# Patient Record
Sex: Female | Born: 1985 | Race: White | Hispanic: No | Marital: Single | Smoking: Former smoker
Health system: Southern US, Community
[De-identification: ages and names within clinical notes are randomized; demographics above are authoritative.]

## PROBLEM LIST (undated history)

## (undated) HISTORY — PX: ABDOMINAL HYSTERECTOMY: SHX81

---

## 2008-10-02 ENCOUNTER — Emergency Department (HOSPITAL_COMMUNITY): Admission: EM | Admit: 2008-10-02 | Discharge: 2008-10-02 | Payer: Self-pay | Admitting: Emergency Medicine

## 2009-12-26 ENCOUNTER — Emergency Department (HOSPITAL_COMMUNITY): Admission: EM | Admit: 2009-12-26 | Discharge: 2009-12-26 | Payer: Self-pay | Admitting: Emergency Medicine

## 2010-06-25 ENCOUNTER — Emergency Department (HOSPITAL_COMMUNITY): Admission: EM | Admit: 2010-06-25 | Discharge: 2010-06-25 | Payer: Self-pay | Admitting: Emergency Medicine

## 2010-12-07 LAB — CBC
HCT: 35 % — ABNORMAL LOW (ref 36.0–46.0)
Hemoglobin: 12 g/dL (ref 12.0–15.0)
MCH: 31.4 pg (ref 26.0–34.0)
MCHC: 34.3 g/dL (ref 30.0–36.0)
MCV: 91.6 fL (ref 78.0–100.0)
RBC: 3.82 MIL/uL — ABNORMAL LOW (ref 3.87–5.11)

## 2010-12-07 LAB — GC/CHLAMYDIA PROBE AMP, GENITAL
Chlamydia, DNA Probe: POSITIVE — AB
GC Probe Amp, Genital: NEGATIVE

## 2010-12-07 LAB — DIFFERENTIAL
Basophils Relative: 0 % (ref 0–1)
Eosinophils Absolute: 0.1 10*3/uL (ref 0.0–0.7)
Eosinophils Relative: 2 % (ref 0–5)
Lymphs Abs: 2.6 10*3/uL (ref 0.7–4.0)
Monocytes Absolute: 0.5 10*3/uL (ref 0.1–1.0)
Monocytes Relative: 5 % (ref 3–12)

## 2010-12-07 LAB — URINALYSIS, ROUTINE W REFLEX MICROSCOPIC
Bilirubin Urine: NEGATIVE
Glucose, UA: NEGATIVE mg/dL
Ketones, ur: NEGATIVE mg/dL
Nitrite: NEGATIVE
Specific Gravity, Urine: 1.01 (ref 1.005–1.030)
pH: 6.5 (ref 5.0–8.0)

## 2010-12-07 LAB — WET PREP, GENITAL
WBC, Wet Prep HPF POC: NONE SEEN
Yeast Wet Prep HPF POC: NONE SEEN

## 2010-12-13 LAB — CBC
HCT: 37.6 % (ref 36.0–46.0)
Hemoglobin: 13.1 g/dL (ref 12.0–15.0)
MCHC: 34.9 g/dL (ref 30.0–36.0)
Platelets: 203 10*3/uL (ref 150–400)
RDW: 13 % (ref 11.5–15.5)

## 2010-12-13 LAB — DIFFERENTIAL
Lymphocytes Relative: 28 % (ref 12–46)
Lymphs Abs: 2.4 10*3/uL (ref 0.7–4.0)
Monocytes Absolute: 0.5 10*3/uL (ref 0.1–1.0)
Monocytes Relative: 6 % (ref 3–12)
Neutro Abs: 5.3 10*3/uL (ref 1.7–7.7)
Neutrophils Relative %: 63 % (ref 43–77)

## 2010-12-13 LAB — COMPREHENSIVE METABOLIC PANEL
Albumin: 3.9 g/dL (ref 3.5–5.2)
BUN: 13 mg/dL (ref 6–23)
Calcium: 9.3 mg/dL (ref 8.4–10.5)
Glucose, Bld: 115 mg/dL — ABNORMAL HIGH (ref 70–99)
Potassium: 4.2 mEq/L (ref 3.5–5.1)
Sodium: 138 mEq/L (ref 135–145)
Total Protein: 6.7 g/dL (ref 6.0–8.3)

## 2010-12-13 LAB — URINALYSIS, ROUTINE W REFLEX MICROSCOPIC
Glucose, UA: NEGATIVE mg/dL
Hgb urine dipstick: NEGATIVE
Protein, ur: NEGATIVE mg/dL
Specific Gravity, Urine: 1.028 (ref 1.005–1.030)
pH: 6.5 (ref 5.0–8.0)

## 2010-12-13 LAB — POCT PREGNANCY, URINE: Preg Test, Ur: NEGATIVE

## 2011-01-08 LAB — DIFFERENTIAL
Lymphs Abs: 1.9 10*3/uL (ref 0.7–4.0)
Monocytes Relative: 4 % (ref 3–12)
Neutro Abs: 7 10*3/uL (ref 1.7–7.7)
Neutrophils Relative %: 73 % (ref 43–77)

## 2011-01-08 LAB — RAPID URINE DRUG SCREEN, HOSP PERFORMED
Barbiturates: NOT DETECTED
Benzodiazepines: NOT DETECTED
Cocaine: NOT DETECTED
Opiates: NOT DETECTED

## 2011-01-08 LAB — BASIC METABOLIC PANEL
Calcium: 9.3 mg/dL (ref 8.4–10.5)
Chloride: 103 mEq/L (ref 96–112)
Creatinine, Ser: 0.54 mg/dL (ref 0.4–1.2)
GFR calc Af Amer: >60 mL/min (ref >60)
Sodium: 136 mEq/L (ref 135–145)

## 2011-01-08 LAB — CBC
Hemoglobin: 12.8 g/dL (ref 12.0–15.0)
MCV: 92.5 fL (ref 78.0–100.0)
RBC: 4.13 MIL/uL (ref 3.87–5.11)
WBC: 9.6 10*3/uL (ref 4.0–10.5)

## 2011-01-08 LAB — ETHANOL: Alcohol, Ethyl (B): <5 mg/dL (ref 0–10)

## 2011-01-08 LAB — TRICYCLICS SCREEN, URINE: TCA Scrn: NOT DETECTED

## 2011-01-08 LAB — URINALYSIS, ROUTINE W REFLEX MICROSCOPIC
Bilirubin Urine: NEGATIVE
Glucose, UA: NEGATIVE mg/dL
Nitrite: NEGATIVE
Specific Gravity, Urine: 1.017 (ref 1.005–1.030)
pH: 6 (ref 5.0–8.0)

## 2020-09-14 ENCOUNTER — Emergency Department (INDEPENDENT_AMBULATORY_CARE_PROVIDER_SITE_OTHER)
Admission: EM | Admit: 2020-09-14 | Discharge: 2020-09-14 | Disposition: A | Discharge status: Home / Self Care | Payer: 59 | Source: Home / Self Care | Attending: Family Medicine | Admitting: Family Medicine

## 2020-09-14 ENCOUNTER — Other Ambulatory Visit: Payer: Self-pay

## 2020-09-14 ENCOUNTER — Emergency Department (INDEPENDENT_AMBULATORY_CARE_PROVIDER_SITE_OTHER): Payer: 59

## 2020-09-14 DIAGNOSIS — N83201 Unspecified ovarian cyst, right side: Secondary | ICD-10-CM | POA: Diagnosis not present

## 2020-09-14 DIAGNOSIS — M545 Low back pain, unspecified: Secondary | ICD-10-CM | POA: Diagnosis not present

## 2020-09-14 LAB — POCT URINALYSIS DIP (MANUAL ENTRY)
Bilirubin, UA: NEGATIVE
Blood, UA: NEGATIVE
Glucose, UA: NEGATIVE mg/dL
Leukocytes, UA: NEGATIVE
Nitrite, UA: NEGATIVE
Protein Ur, POC: NEGATIVE mg/dL
Spec Grav, UA: 1.025 (ref 1.010–1.025)
Urobilinogen, UA: 0.2 E.U./dL
pH, UA: 6 (ref 5.0–8.0)

## 2020-09-14 LAB — POCT URINE PREGNANCY: Preg Test, Ur: NEGATIVE

## 2020-09-14 MED ORDER — IOHEXOL 300 MG/ML  SOLN
100.0000 mL | Freq: Once | INTRAMUSCULAR | Status: AC | PRN
  Administered 2020-09-14: 100 mL via INTRAVENOUS

## 2020-09-14 MED ORDER — PREDNISONE 10 MG PO TABS: 20.0000 mg | ORAL_TABLET | Freq: Every day | ORAL | 0 refills | Status: AC

## 2020-09-14 MED ORDER — KETOROLAC TROMETHAMINE 60 MG/2ML IM SOLN
60.0000 mg | Freq: Once | INTRAMUSCULAR | Status: AC
  Administered 2020-09-14: 60 mg via INTRAMUSCULAR

## 2020-09-14 NOTE — ED Triage Notes (Signed)
Pt presents with lower R back pain that woke her up. Patient thinks that it may be a kidney stone. Patient has had kidney stone previously.

## 2020-09-14 NOTE — ED Provider Notes (Signed)
[facility]    CSN: [id number] Arrival date & time: 09/14/20  1356      History   Chief Complaint Chief Complaint  Patient presents with  . Back Pain    Lower right     HPI Erin Johnson is a 34 y.o. female.  Patient presents with sudden onset of right lower back pain with radiation around to the right lower quadrant.  She has a history of degenerative disc disease as well as kidney stones.  There is no radiation to the leg.  There is no difficulty urinating.   HPI  History reviewed. No pertinent past medical history.  There are no problems to display for this patient.   Past Surgical History:  Procedure Laterality Date  . ABDOMINAL HYSTERECTOMY      OB History   No obstetric history on file.      Home Medications    Prior to Admission medications   Medication Sig Start Date End Date Taking? Authorizing Provider  amitriptyline (ELAVIL) 25 MG tablet Take 1 tablet by mouth daily. 01/01/20  Yes [provider]  buPROPion (WELLBUTRIN XL) 300 MG 24 hr tablet Take by mouth. 01/01/20  Yes [provider]  FLUoxetine (PROZAC) 20 MG capsule Take by mouth. 01/01/20  Yes [provider]  hydrOXYzine (ATARAX/VISTARIL) 50 MG tablet Take by mouth. 06/01/16  Yes [provider]  tiZANidine (ZANAFLEX) 4 MG tablet Take by mouth. 01/01/20  Yes [provider]  valACYclovir (VALTREX) 1000 MG tablet Take 2 tablets at the onset and repeat in 12 hours 08/10/20  Yes [provider]    Family History Family History  Problem Relation Age of Onset  . Diabetes Mother     Social History Social History   Tobacco Use  . Smoking status: Former Smoker    Packs/day: 1.00    Years: 15.00    Pack years: 15.00    Quit date: 2019    Years since quitting: 2.9  . Smokeless tobacco: Never Used  Vaping Use  . Vaping Use: Every day  Substance Use Topics  . Alcohol use: Yes    Comment: socially   . Drug use: Never      Allergies   Penicillins   Review of Systems Review of Systems  Gastrointestinal: Positive for abdominal pain.  Musculoskeletal: Positive for back pain.  All other systems reviewed and are negative.    Physical Exam Triage Vital Signs ED Triage Vitals  Enc Vitals Group     BP 09/14/20 1414 133/77     Pulse Rate 09/14/20 1414 88     Resp 09/14/20 1414 20     Temp 09/14/20 1414 98.6 F (37 C)     Temp Source 09/14/20 1414 Oral     SpO2 09/14/20 1414 98 %     Weight 09/14/20 1410 165 lb (74.8 kg)     Height 09/14/20 1410 5\' 1"  (1.549 m)     Head Circumference --      Peak Flow --      Pain Score 09/14/20 1410 7     Pain Loc --      Pain Edu? --      Excl. in GC? --    No data found.  Updated Vital Signs BP 133/77 (BP Location: Right Arm)   Pulse 88   Temp 98.6 F (37 C) (Oral)   Resp 20   Ht 5\' 1"  (1.549 m)   Wt 74.8 kg   SpO2  98%   BMI 31.18 kg/m   Visual Acuity Right Eye Distance:   Left Eye Distance:   Bilateral Distance:    Right Eye Near:   Left Eye Near:    Bilateral Near:     Physical Exam Vitals and nursing note reviewed.  Constitutional:      Appearance: Normal appearance.  Cardiovascular:     Rate and Rhythm: Normal rate and regular rhythm.  Pulmonary:     Effort: Pulmonary effort is normal.     Breath sounds: Normal breath sounds.  Abdominal:     General: Abdomen is flat.     Palpations: Abdomen is soft.     Tenderness: There is abdominal tenderness.     Comments: Right lower quadrant No rebound  Musculoskeletal:     Comments: Back: Normal range of motion Straight leg raising is negative No tenderness with percussion over the lumbar spine  Neurological:     General: No focal deficit present.     Mental Status: She is alert and oriented to person, place, and time.     Deep Tendon Reflexes: Reflexes normal.      UC Treatments / Results  Labs (all labs ordered are listed, but only abnormal results are displayed) Labs  Reviewed  POCT URINALYSIS DIP (MANUAL ENTRY) - Abnormal; Notable for the following components:      Result Value   Ketones, POC UA trace (5) (*)    All other components within normal limits    EKG   Radiology No results found.  Procedures Procedures (including critical care time)  Medications Ordered in UC Medications - No data to display  Initial Impression / Assessment and Plan / UC Course  I have reviewed the triage vital signs and the nursing notes.  Pertinent labs & imaging results that were available during my care of the patient were reviewed by me and considered in my medical decision making (see chart for details).     Back and abdominal pain.  Patient underwent CT scan which showed no acute appendix or stone.  There was no blood in the urine. Final Clinical Impressions(s) / UC Diagnoses   Final diagnoses:  None   Discharge Instructions   None    ED Prescriptions    None     PDMP not reviewed this encounter.   Erin Kuster, MD 09/14/20 [phone]

## 2021-12-26 ENCOUNTER — Emergency Department (INDEPENDENT_AMBULATORY_CARE_PROVIDER_SITE_OTHER): Payer: No Typology Code available for payment source

## 2021-12-26 ENCOUNTER — Emergency Department
Admission: EM | Admit: 2021-12-26 | Discharge: 2021-12-26 | Disposition: A | Discharge status: Home / Self Care | Payer: No Typology Code available for payment source | Source: Home / Self Care

## 2021-12-26 DIAGNOSIS — M25571 Pain in right ankle and joints of right foot: Secondary | ICD-10-CM

## 2021-12-26 NOTE — ED Provider Notes (Signed)
?[facility] ? ? ? ?CSN: [id number] ?Arrival date & time: 12/26/21  1254 ? ? ?  ? ?History   ?Chief Complaint ?Chief Complaint  ?Patient presents with  ? Ankle Pain  ?  RT  ? ? ?HPI ?Erin Johnson is a 36 y.o. female.  ? ?HPI 36 year old female presents with right ankle pain since last night when she rolled it at her kids softball game.  Patient reports hearing a pop sound with bruising and swelling starting today.  Patient reports limited range of motion when walking.  Reports pain currently as 8 of 10. ? ?History reviewed. No pertinent past medical history. ? ?There are no problems to display for this patient. ? ? ?Past Surgical History:  ?Procedure Laterality Date  ? ABDOMINAL HYSTERECTOMY    ? ? ?OB History   ?No obstetric history on file. ?  ? ? ? ?Home Medications   ? ?Prior to Admission medications   ?Medication Sig Start Date End Date Taking? Authorizing Provider  ?sertraline (ZOLOFT) 50 MG tablet Take 1 tablet by mouth daily. 11/25/15  Yes [provider]  ?amitriptyline (ELAVIL) 25 MG tablet Take 1 tablet by mouth daily. 01/01/20   [provider]  ?buPROPion (WELLBUTRIN XL) 300 MG 24 hr tablet Take by mouth. 01/01/20   [provider]  ?cyclobenzaprine (FLEXERIL) 10 MG tablet Take 10 mg by mouth at bedtime. 12/21/21   [provider]  ?FLUoxetine (PROZAC) 20 MG capsule Take by mouth. 01/01/20   [provider]  ?hydrOXYzine (ATARAX/VISTARIL) 50 MG tablet Take by mouth. 06/01/16   [provider]  ?predniSONE (DELTASONE) 10 MG tablet Take 2 tablets (20 mg total) by mouth daily. 09/14/20   Frederica Kuster, MD  ?tiZANidine (ZANAFLEX) 4 MG tablet Take by mouth. 01/01/20   [provider]  ?valACYclovir (VALTREX) 1000 MG tablet Take 2 tablets at the onset and repeat in 12 hours 08/10/20   [provider]  ? ? ?Family History ?Family History  ?Problem Relation Age of Onset  ? Diabetes Mother   ? ? ?Social History ?Social History   ? ?Tobacco Use  ? Smoking status: Former  ?  Packs/day: 1.00  ?  Years: 15.00  ?  Pack years: 15.00  ?  Types: Cigarettes  ?  Quit date: 2019  ?  Years since quitting: 4.2  ? Smokeless tobacco: Never  ?Vaping Use  ? Vaping Use: Every day  ?Substance Use Topics  ? Alcohol use: Yes  ?  Comment: socially   ? Drug use: Never  ? ? ? ?Allergies   ?Penicillins ? ? ?Review of Systems ?Review of Systems  ?Musculoskeletal:   ?     Right ankle pain x1 day  ? ? ?Physical Exam ?Triage Vital Signs ?ED Triage Vitals  ?Enc Vitals Group  ?   BP 12/26/21 1329 122/84  ?   Pulse Rate 12/26/21 1329 94  ?   Resp 12/26/21 1329 17  ?   Temp 12/26/21 1329 98.3 ?F (36.8 ?C)  ?   Temp Source 12/26/21 1329 Oral  ?   SpO2 12/26/21 1329 100 %  ?   Weight --   ?   Height --   ?   Head Circumference --   ?   Peak Flow --   ?   Pain Score 12/26/21 1330 8  ?   Pain Loc --   ?   Pain Edu? --   ?   Excl. in GC? --   ? ?  No data found. ? ?Updated Vital Signs ?BP 122/84 (BP Location: Right Arm)   Pulse 94   Temp 98.3 ?F (36.8 ?C) (Oral)   Resp 17   SpO2 100%  ? ? ?Physical Exam ?Vitals and nursing note reviewed.  ?Constitutional:   ?   Appearance: Normal appearance. She is obese.  ?HENT:  ?   Head: Normocephalic and atraumatic.  ?   Mouth/Throat:  ?   Mouth: Mucous membranes are moist.  ?   Pharynx: Oropharynx is clear.  ?Eyes:  ?   Extraocular Movements: Extraocular movements intact.  ?   Conjunctiva/sclera: Conjunctivae normal.  ?   Pupils: Pupils are equal, round, and reactive to light.  ?Cardiovascular:  ?   Rate and Rhythm: Normal rate and regular rhythm.  ?   Pulses: Normal pulses.  ?   Heart sounds: Normal heart sounds. No murmur heard. ?Pulmonary:  ?   Effort: Pulmonary effort is normal.  ?   Breath sounds: Normal breath sounds. No wheezing, rhonchi or rales.  ?Musculoskeletal:  ?   Cervical back: Normal range of motion and neck supple.  ?   Comments: Right ankle: Moderate soft tissue swelling noted over medial malleolus, limited range of  motion with dorsiflexion/plantarflexion  ?Skin: ?   General: Skin is warm and dry.  ?Neurological:  ?   General: No focal deficit present.  ?   Mental Status: She is alert and oriented to person, place, and time. Mental status is at baseline.  ? ? ? ?UC Treatments / Results  ?Labs ?(all labs ordered are listed, but only abnormal results are displayed) ?Labs Reviewed - No data to display ? ?EKG ? ? ?Radiology ?DG Ankle Complete Right ? ?Result Date: 12/26/2021 ?CLINICAL DATA:  Right ankle pain EXAM: RIGHT ANKLE - COMPLETE 3+ VIEW COMPARISON:  None. FINDINGS: There is no evidence of fracture, dislocation, or joint effusion. Well corticated osseous density inferior to the tip of the lateral malleolus may represent unfused ossification center or sequela of remote trauma. Os peroneum noted. There is no evidence of arthropathy or other focal bone abnormality. Soft tissues are unremarkable. IMPRESSION: Negative. Electronically Signed   By: Duanne GuessNicholas  Plundo D.O.   On: 12/26/2021 13:50   ? ?Procedures ?Procedures (including critical care time) ? ?Medications Ordered in UC ?Medications - No data to display ? ?Initial Impression / Assessment and Plan / UC Course  ?I have reviewed the triage vital signs and the nursing notes. ? ?Pertinent labs & imaging results that were available during my care of the patient were reviewed by me and considered in my medical decision making (see chart for details). ? ?  ? ?MDM: 1.  Right ankle pain-Advised/informed patient of right ankle x-ray results.  Advised patient may RICE right ankle for 25 minutes 2-3 times daily for the next 3 days.  Additionally patient may use OTC Ibuprofen 600 to 800 mg 1-2 times daily, as needed for right ankle pain and swelling.  Advised patient if symptoms worsen and/or unresolved after 7 to 10 days [facility] (contact information has been provided with this AVS above) for further evaluation of right ankle pain.  Patient  discharged home, hemodynamically stable. ? ?Final Clinical Impressions(s) / UC Diagnoses  ? ?Final diagnoses:  ?Acute right ankle pain  ? ? ? ?Discharge Instructions   ? ?  ?Advised/informed patient of right ankle x-ray results.  Advised patient may RICE right ankle for 25 minutes 2-3 times daily for the  next 3 days.  Additionally patient may use OTC Ibuprofen 600 to 800 mg 1-2 times daily, as needed for right ankle pain and swelling.  Advised patient if symptoms worsen and/or unresolved after 7 to 10 days please follow-up with [facility] [place] orthopedic provider (contact information has been provided with this AVS above) for further evaluation of right ankle pain. ? ? ? ? ?ED Prescriptions   ?None ?  ? ?PDMP not reviewed this encounter. ?  ?Trevor Iha, FNP ?12/26/21 1438 ? ?

## 2021-12-26 NOTE — ED Triage Notes (Signed)
Pt here today for RT ankle pain since last night when she rolled it at her kids softball game. Heard a pop sound. Bruising and swelling started today. Limited ROM. Pain 8/10 when walking. Ice and elevated last night. No OTC meds taken. ?

## 2021-12-26 NOTE — Discharge Instructions (Addendum)
Advised/informed patient of right ankle x-ray results.  Advised patient may RICE right ankle for 25 minutes 2-3 times daily for the next 3 days.  Additionally patient may use OTC Ibuprofen 600 to 800 mg 1-2 times daily, as needed for right ankle pain and swelling.  Advised patient if symptoms worsen and/or unresolved after 7 to 10 days please follow-up with [facility] orthopedic provider (contact information has been provided with this AVS above) for further evaluation of right ankle pain. ?

## 2022-10-26 IMAGING — DX DG ANKLE COMPLETE 3+V*R*
3 series · 3 of 3 positions shown · non-contrast
Comparison: None.

CLINICAL DATA: Right ankle pain

EXAM:
RIGHT ANKLE - COMPLETE 3+ VIEW

[ankle ap]
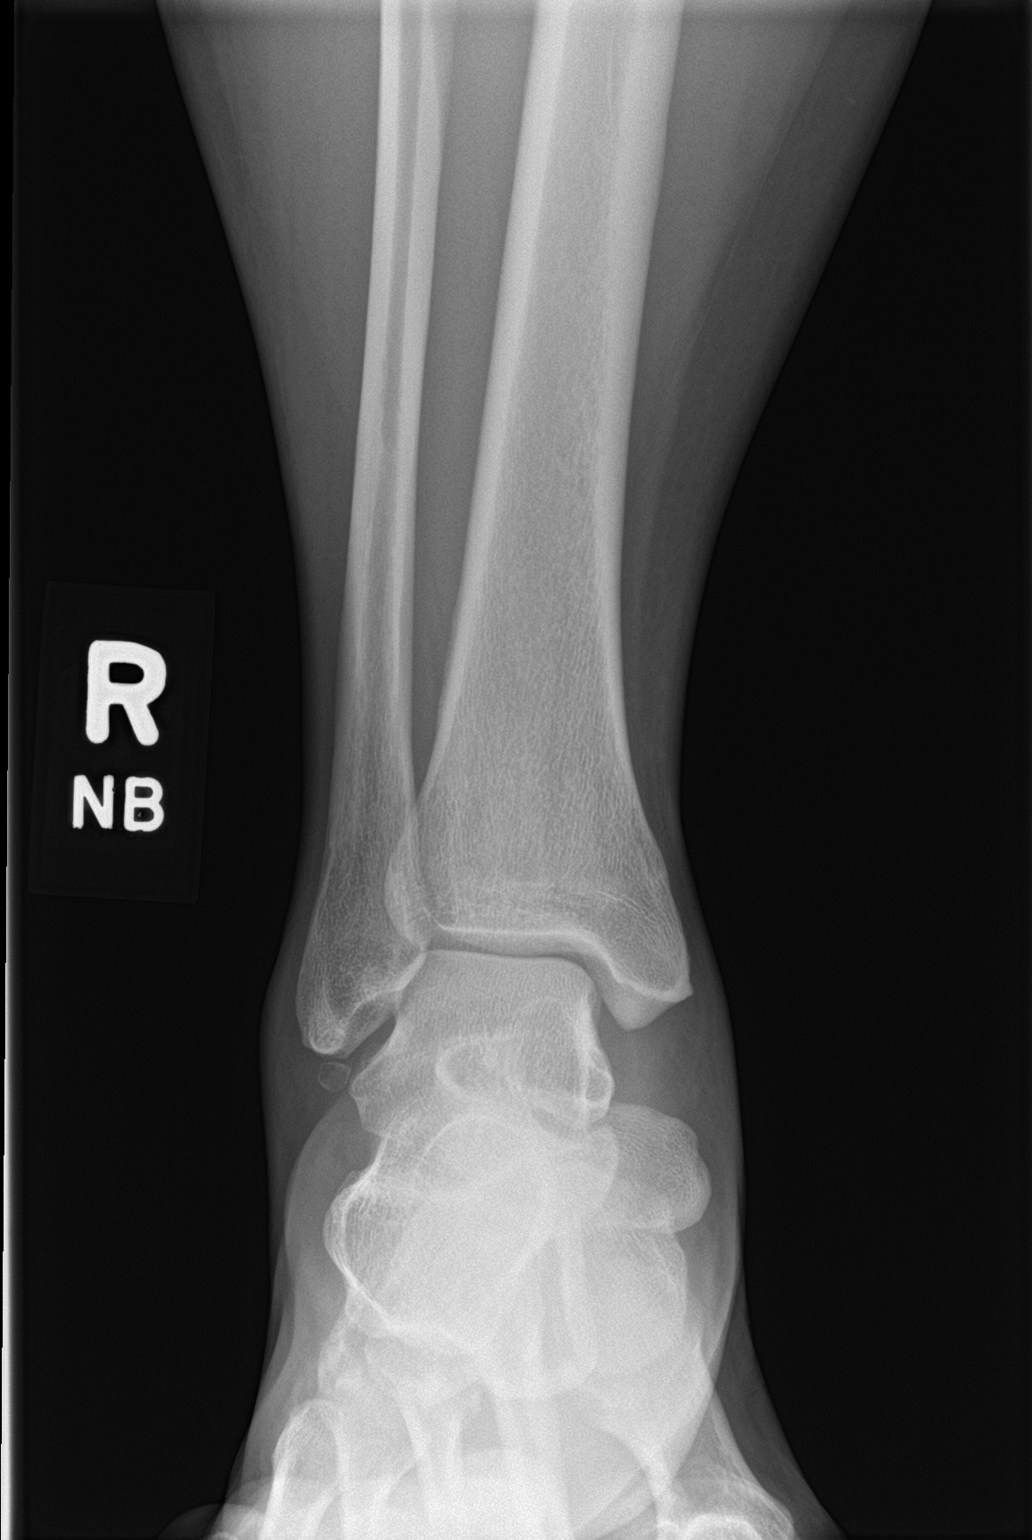

[ankle obl]
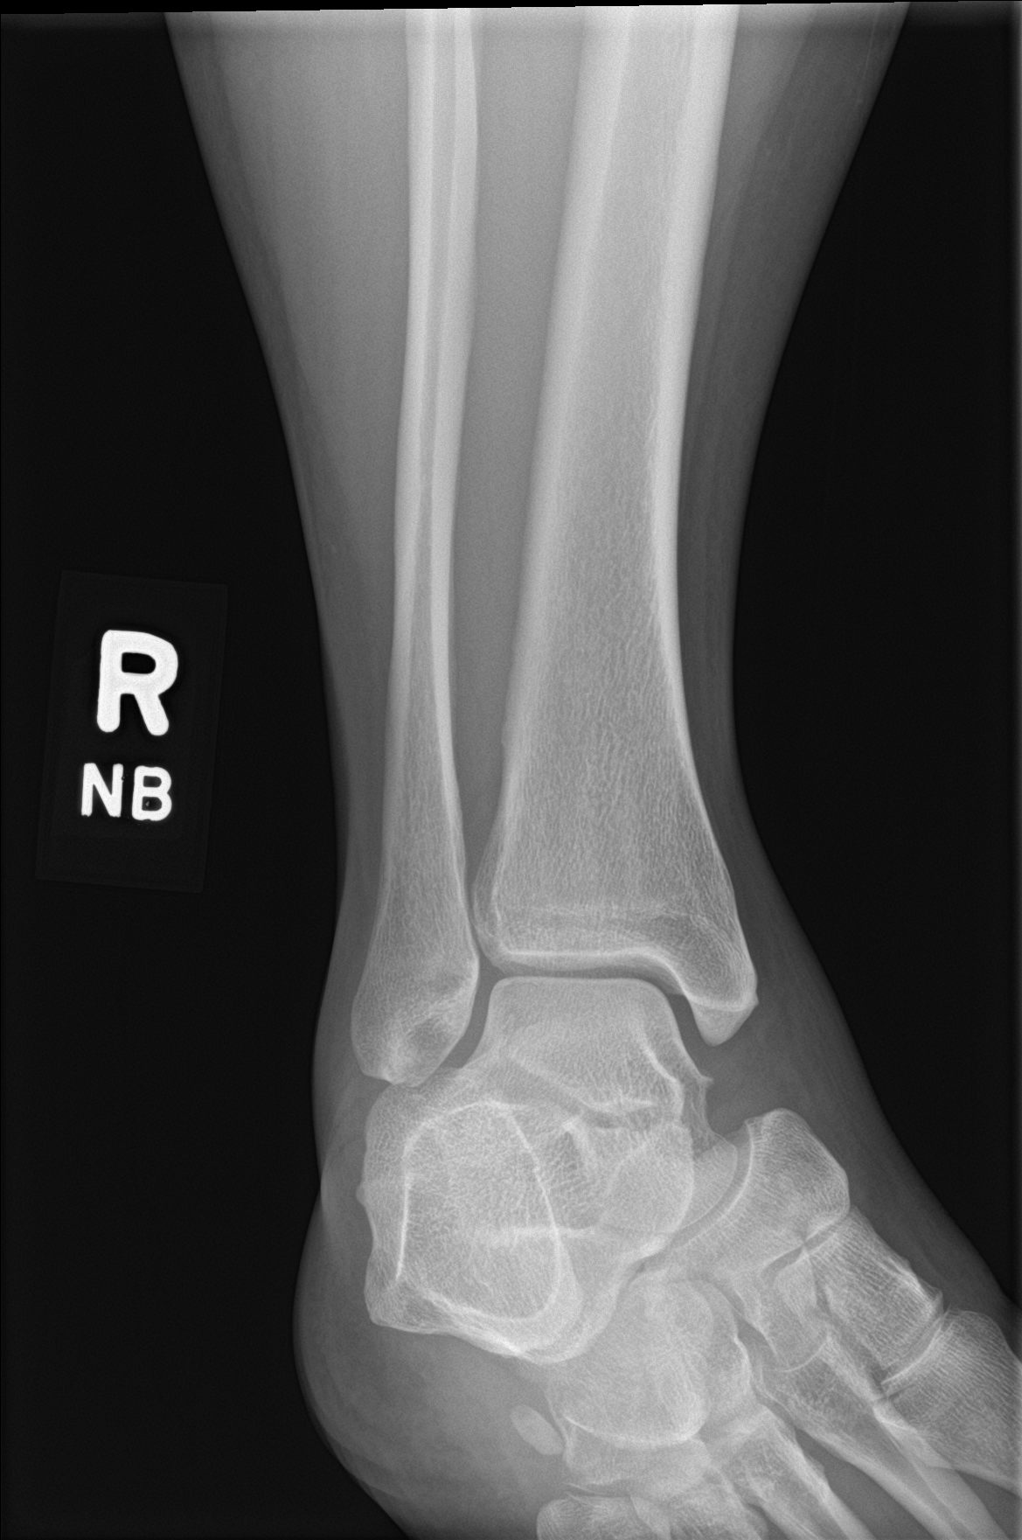

[ankle lat]
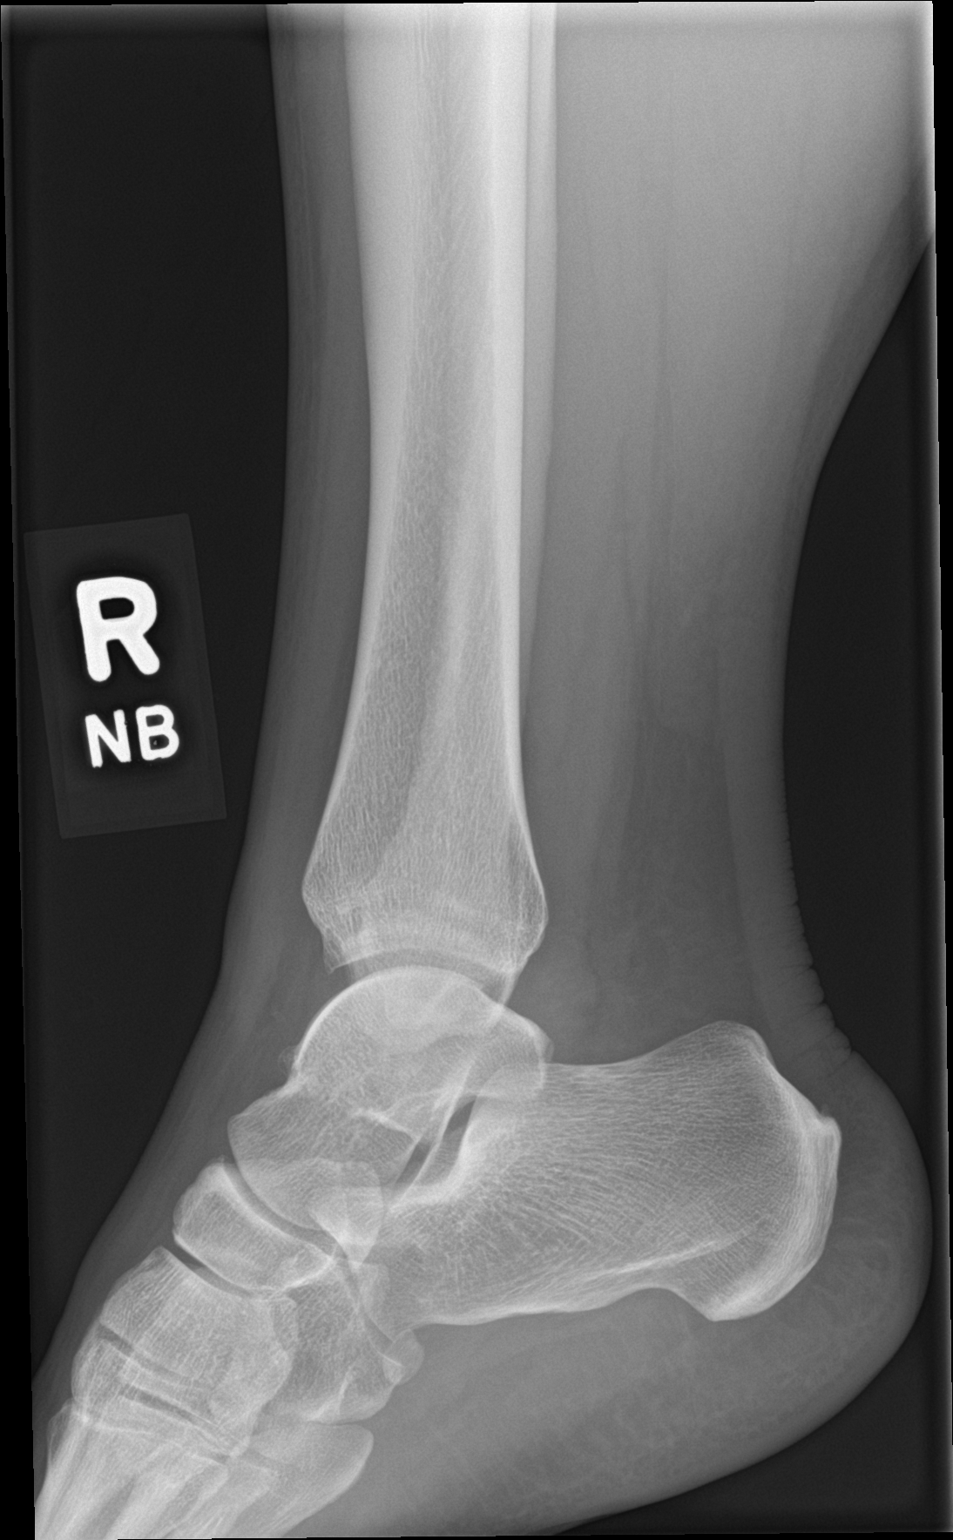

[3 of 3 positions shown; findings below may reference images not displayed]

FINDINGS: There is no evidence of fracture, dislocation, or joint effusion.
Well corticated osseous density inferior to the tip of the lateral
malleolus may represent unfused ossification center or sequela of
remote trauma. Os peroneum noted. There is no evidence of
arthropathy or other focal bone abnormality. Soft tissues are
unremarkable.
IMPRESSION: Negative.
